# Patient Record
Sex: Male | Born: 1985 | Race: White | Hispanic: No | Smoking: Former smoker
Health system: Southern US, Community
[De-identification: ages and names within clinical notes are randomized; demographics above are authoritative.]

## PROBLEM LIST (undated history)

## (undated) DIAGNOSIS — E119 Type 2 diabetes mellitus without complications: Secondary | ICD-10-CM

## (undated) DIAGNOSIS — F411 Generalized anxiety disorder: Secondary | ICD-10-CM

## (undated) DIAGNOSIS — R7989 Other specified abnormal findings of blood chemistry: Secondary | ICD-10-CM

## (undated) DIAGNOSIS — S6290XA Unspecified fracture of unspecified wrist and hand, initial encounter for closed fracture: Secondary | ICD-10-CM

## (undated) HISTORY — DX: Other specified abnormal findings of blood chemistry: R79.89

## (undated) HISTORY — DX: Unspecified fracture of unspecified hand, initial encounter for closed fracture: S62.90XA

## (undated) HISTORY — DX: Type 2 diabetes mellitus without complications: E11.9

## (undated) HISTORY — DX: Generalized anxiety disorder: F41.1

---

## 2016-07-30 DIAGNOSIS — Z794 Long term (current) use of insulin: Secondary | ICD-10-CM | POA: Insufficient documentation

## 2016-07-30 DIAGNOSIS — G4733 Obstructive sleep apnea (adult) (pediatric): Secondary | ICD-10-CM | POA: Insufficient documentation

## 2016-07-30 DIAGNOSIS — E785 Hyperlipidemia, unspecified: Secondary | ICD-10-CM | POA: Insufficient documentation

## 2016-07-30 DIAGNOSIS — E119 Type 2 diabetes mellitus without complications: Secondary | ICD-10-CM | POA: Insufficient documentation

## 2017-04-17 ENCOUNTER — Emergency Department
Admission: EM | Admit: 2017-04-17 | Discharge: 2017-04-17 | Disposition: A | Discharge status: Other Acute Inpatient Hospital | Payer: Self-pay | Attending: Emergency Medicine | Admitting: Emergency Medicine

## 2017-04-17 ENCOUNTER — Emergency Department: Payer: Self-pay

## 2017-04-17 DIAGNOSIS — T5891XA Toxic effect of carbon monoxide from unspecified source, accidental (unintentional), initial encounter: Secondary | ICD-10-CM | POA: Insufficient documentation

## 2017-04-17 DIAGNOSIS — T5892XA Toxic effect of carbon monoxide from unspecified source, intentional self-harm, initial encounter: Secondary | ICD-10-CM

## 2017-04-17 DIAGNOSIS — Z5181 Encounter for therapeutic drug level monitoring: Secondary | ICD-10-CM | POA: Insufficient documentation

## 2017-04-17 LAB — CBC
HCT: 54.1 % — ABNORMAL HIGH (ref 40.0–52.0)
HEMOGLOBIN: 18.3 g/dL — AB (ref 13.0–18.0)
MCH: 29.9 pg (ref 26.0–34.0)
MCHC: 33.9 g/dL (ref 32.0–36.0)
MCV: 88.2 fL (ref 80.0–100.0)
PLATELETS: 234 10*3/uL (ref 150–440)
RBC: 6.13 MIL/uL — AB (ref 4.40–5.90)
RDW: 13.6 % (ref 11.5–14.5)
WBC: 27.1 10*3/uL — AB (ref 3.8–10.6)

## 2017-04-17 LAB — COMPREHENSIVE METABOLIC PANEL
ALBUMIN: 5 g/dL (ref 3.5–5.0)
ALK PHOS: 86 U/L (ref 38–126)
ALT: 48 U/L (ref 17–63)
ANION GAP: 16 — AB (ref 5–15)
AST: 105 U/L — AB (ref 15–41)
BILIRUBIN TOTAL: 1 mg/dL (ref 0.3–1.2)
BUN: 20 mg/dL (ref 6–20)
CO2: 20 mmol/L — ABNORMAL LOW (ref 22–32)
Calcium: 9.4 mg/dL (ref 8.9–10.3)
Chloride: 102 mmol/L (ref 101–111)
Creatinine, Ser: 2.02 mg/dL — ABNORMAL HIGH (ref 0.61–1.24)
GFR calc Af Amer: 48 mL/min — ABNORMAL LOW (ref >60)
GFR, EST NON AFRICAN AMERICAN: 42 mL/min — AB (ref >60)
GLUCOSE: 292 mg/dL — AB (ref 65–99)
Potassium: 5.2 mmol/L — ABNORMAL HIGH (ref 3.5–5.1)
Sodium: 138 mmol/L (ref 135–145)
TOTAL PROTEIN: 8.8 g/dL — AB (ref 6.5–8.1)

## 2017-04-17 LAB — URINE DRUG SCREEN, QUALITATIVE (ARMC ONLY)
Amphetamines, Ur Screen: NOT DETECTED
BARBITURATES, UR SCREEN: NOT DETECTED
BENZODIAZEPINE, UR SCRN: NOT DETECTED
COCAINE METABOLITE, UR ~~LOC~~: NOT DETECTED
Cannabinoid 50 Ng, Ur ~~LOC~~: NOT DETECTED
MDMA (Ecstasy)Ur Screen: NOT DETECTED
METHADONE SCREEN, URINE: NOT DETECTED
OPIATE, UR SCREEN: NOT DETECTED
PHENCYCLIDINE (PCP) UR S: NOT DETECTED
Tricyclic, Ur Screen: NOT DETECTED

## 2017-04-17 LAB — COOXEMETRY PANEL
O2 Saturation: 100 %
O2 Saturation: 96.8 %

## 2017-04-17 LAB — BLOOD GAS, ARTERIAL
ACID-BASE DEFICIT: 8.2 mmol/L — AB (ref 0.0–2.0)
Bicarbonate: 19.6 mmol/L — ABNORMAL LOW (ref 20.0–28.0)
FIO2: 1
O2 SAT: 96.4 %
PATIENT TEMPERATURE: 37
PCO2 ART: 48 mmHg (ref 32.0–48.0)
pH, Arterial: 7.22 — ABNORMAL LOW (ref 7.350–7.450)
pO2, Arterial: 101 mmHg (ref 83.0–108.0)

## 2017-04-17 LAB — SALICYLATE LEVEL: Salicylate Lvl: <7 mg/dL (ref 2.8–30.0)

## 2017-04-17 LAB — URINALYSIS, COMPLETE (UACMP) WITH MICROSCOPIC
BILIRUBIN URINE: NEGATIVE
Bacteria, UA: NONE SEEN
KETONES UR: 20 mg/dL — AB
LEUKOCYTES UA: NEGATIVE
NITRITE: NEGATIVE
PH: 5 (ref 5.0–8.0)
Protein, ur: 100 mg/dL — AB
SPECIFIC GRAVITY, URINE: 1.014 (ref 1.005–1.030)

## 2017-04-17 LAB — TROPONIN I: TROPONIN I: 0.37 ng/mL — AB (ref <0.03)

## 2017-04-17 LAB — ACETAMINOPHEN LEVEL

## 2017-04-17 LAB — ETHANOL: Alcohol, Ethyl (B): <5 mg/dL (ref <5)

## 2017-04-17 MED ORDER — SUCCINYLCHOLINE CHLORIDE 20 MG/ML IJ SOLN
200.0000 mg | Freq: Once | INTRAMUSCULAR | Status: AC
  Administered 2017-04-17: 200 mg via INTRAVENOUS

## 2017-04-17 MED ORDER — ETOMIDATE 2 MG/ML IV SOLN
20.0000 mg | Freq: Once | INTRAVENOUS | Status: AC
  Administered 2017-04-17: 20 mg via INTRAVENOUS

## 2017-04-17 MED ORDER — SODIUM CHLORIDE 0.9 % IV BOLUS (SEPSIS)
1000.0000 mL | Freq: Once | INTRAVENOUS | Status: AC
  Administered 2017-04-17: 1000 mL via INTRAVENOUS

## 2017-04-17 MED ORDER — PROPOFOL 1000 MG/100ML IV EMUL
INTRAVENOUS | Status: AC
  Administered 2017-04-17: 13.889 ug/kg/min via INTRAVENOUS
  Filled 2017-04-17: qty 100

## 2017-04-17 MED ORDER — ROCURONIUM BROMIDE 50 MG/5ML IV SOLN
10.0000 mg | Freq: Once | INTRAVENOUS | Status: AC
  Administered 2017-04-17: 10 mg via INTRAVENOUS

## 2017-04-17 MED ORDER — PROPOFOL 1000 MG/100ML IV EMUL
INTRAVENOUS | Status: AC
  Filled 2017-04-17: qty 100

## 2017-04-17 MED ORDER — PROPOFOL 1000 MG/100ML IV EMUL
5.0000 ug/kg/min | Freq: Once | INTRAVENOUS | Status: AC
  Administered 2017-04-17: 13.889 ug/kg/min via INTRAVENOUS

## 2017-04-17 NOTE — ED Notes (Signed)
Report given to [facility] ed charge nurse, Jeanice Lim

## 2017-04-17 NOTE — ED Notes (Signed)
Report given to [facility] 

## 2017-04-17 NOTE — ED Notes (Signed)
7.5 ETT inserted by Dr Manson Passey, taped 25 at the lip and secured; placement verified by CO2 detector and auscultation

## 2017-04-17 NOTE — ED Notes (Signed)
[facility] at bedside 

## 2017-04-17 NOTE — ED Notes (Signed)
16Fr temp foley placed without difficulty; draining tea colored urine

## 2017-04-17 NOTE — ED Notes (Signed)
PCXR completed.

## 2017-04-17 NOTE — ED Triage Notes (Addendum)
Pt arrives unresponsive, possible suicide attempt
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
## 2017-04-17 NOTE — ED Notes (Addendum)
Dr Manson Passey notified of pt's troponin; lung sounds clear; apical audible & regular; abd soft/nondist, +PP, -edema

## 2017-04-19 DIAGNOSIS — T5892XA Toxic effect of carbon monoxide from unspecified source, intentional self-harm, initial encounter: Secondary | ICD-10-CM | POA: Insufficient documentation

## 2017-04-24 DIAGNOSIS — M21372 Foot drop, left foot: Secondary | ICD-10-CM | POA: Insufficient documentation

## 2017-04-24 DIAGNOSIS — R29898 Other symptoms and signs involving the musculoskeletal system: Secondary | ICD-10-CM | POA: Insufficient documentation

## 2017-05-05 ENCOUNTER — Ambulatory Visit: Payer: Self-pay | Attending: Nephrology | Admitting: Physical Therapy

## 2017-06-15 DIAGNOSIS — F3341 Major depressive disorder, recurrent, in partial remission: Secondary | ICD-10-CM | POA: Insufficient documentation

## 2018-08-02 IMAGING — DX DG ABDOMEN 1V
1 series · 1 of 1 positions shown · non-contrast
Comparison: None.

CLINICAL DATA: OG tube placement.

EXAM:
ABDOMEN - 1 VIEW

[abdomen kub]
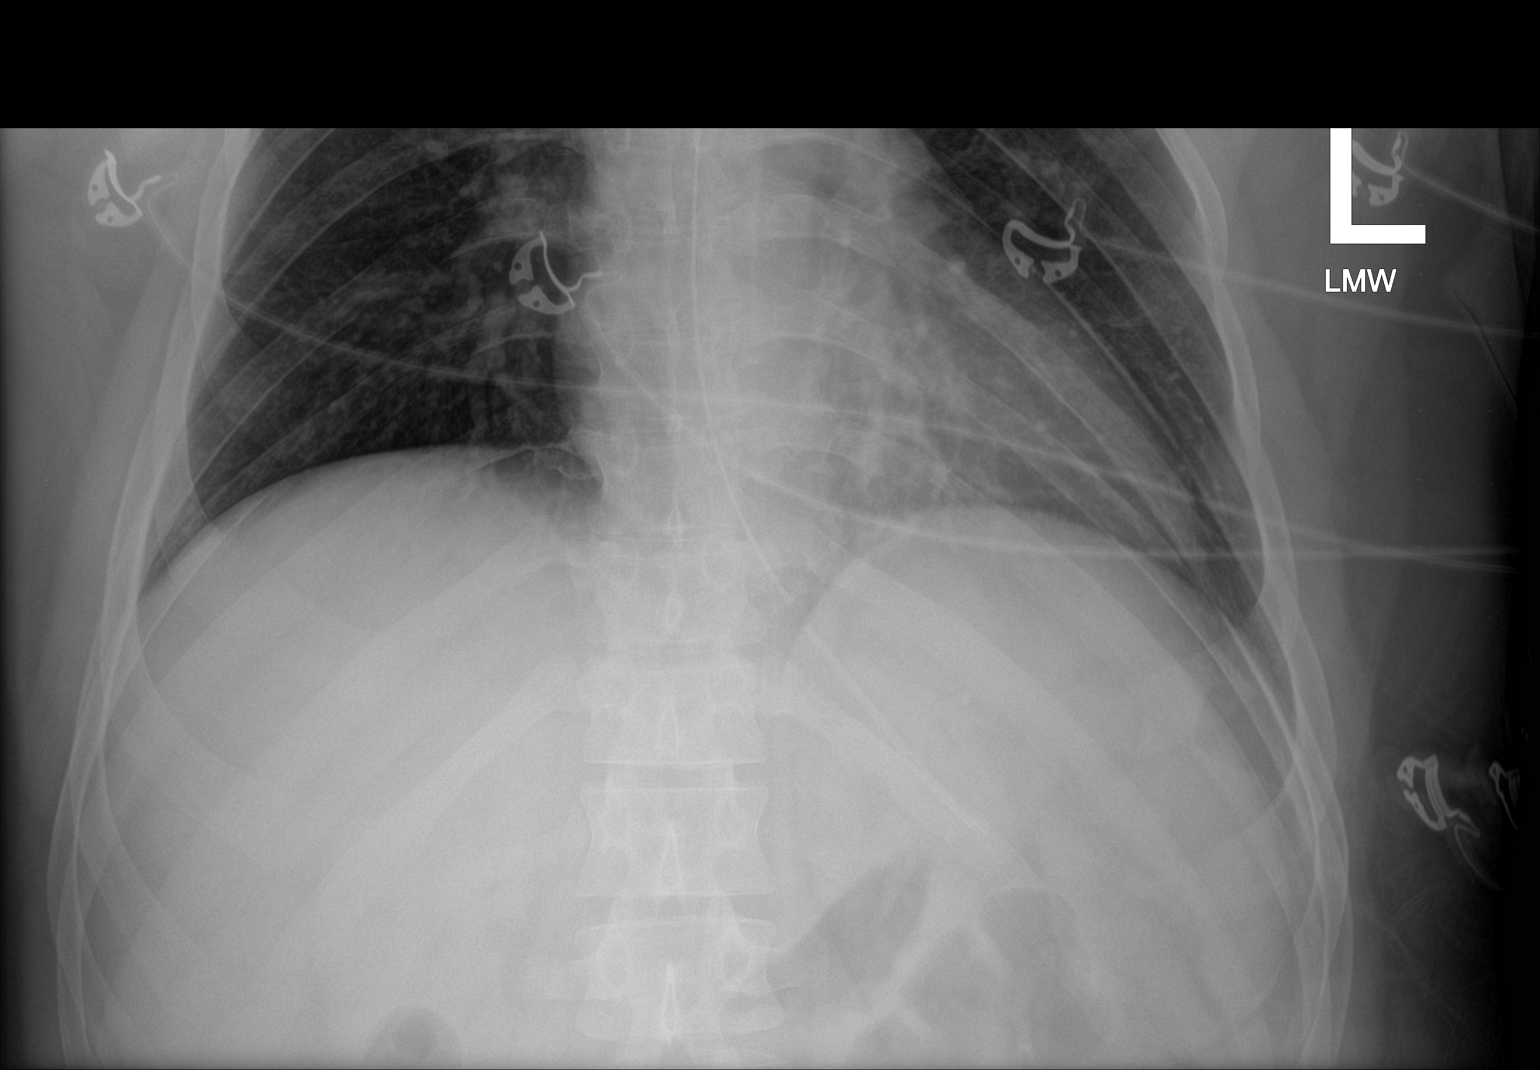

[1 of 1 positions shown; findings below may reference images not displayed]

FINDINGS: Tip of the enteric tube is below the diaphragm in the stomach,
side-port in the region of the gastroesophageal junction. Recommend
advancement of at least 4 cm for optimal placement. Upper abdominal
bowel gas pattern is normal. No evidence of free air.
IMPRESSION: Tip of the enteric tube below the diaphragm. The side port is in the
region of the gastroesophageal junction, advancement of at least 4
cm is recommended for optimal placement.

## 2019-12-12 DIAGNOSIS — K760 Fatty (change of) liver, not elsewhere classified: Secondary | ICD-10-CM | POA: Insufficient documentation

## 2020-04-03 ENCOUNTER — Encounter (HOSPITAL_COMMUNITY): Payer: Self-pay | Admitting: Licensed Clinical Social Worker

## 2020-04-03 ENCOUNTER — Ambulatory Visit (INDEPENDENT_AMBULATORY_CARE_PROVIDER_SITE_OTHER): Payer: 59 | Admitting: Licensed Clinical Social Worker

## 2020-04-03 ENCOUNTER — Other Ambulatory Visit: Payer: Self-pay

## 2020-04-03 DIAGNOSIS — F411 Generalized anxiety disorder: Secondary | ICD-10-CM | POA: Insufficient documentation

## 2020-04-03 DIAGNOSIS — F4310 Post-traumatic stress disorder, unspecified: Secondary | ICD-10-CM | POA: Insufficient documentation
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
## 2020-04-04 ENCOUNTER — Ambulatory Visit (INDEPENDENT_AMBULATORY_CARE_PROVIDER_SITE_OTHER): Payer: 59 | Admitting: Psychiatry

## 2020-04-04 ENCOUNTER — Other Ambulatory Visit: Payer: Self-pay

## 2020-04-04 ENCOUNTER — Encounter (HOSPITAL_COMMUNITY): Payer: Self-pay | Admitting: Psychiatry

## 2020-04-04 DIAGNOSIS — F411 Generalized anxiety disorder: Secondary | ICD-10-CM

## 2020-04-04 DIAGNOSIS — F410 Panic disorder [episodic paroxysmal anxiety] without agoraphobia: Secondary | ICD-10-CM | POA: Diagnosis not present

## 2020-04-04 DIAGNOSIS — F4310 Post-traumatic stress disorder, unspecified: Secondary | ICD-10-CM | POA: Diagnosis not present

## 2020-04-04 MED ORDER — MIRTAZAPINE 15 MG PO TABS: ORAL_TABLET | ORAL | 0 refills | Status: DC

## 2020-04-04 MED ORDER — PRAZOSIN HCL 2 MG PO CAPS: 2.0000 mg | ORAL_CAPSULE | Freq: Every day | ORAL | 2 refills | Status: DC

## 2020-04-04 MED ORDER — ZOLPIDEM TARTRATE 5 MG PO TABS
5.0000 mg | ORAL_TABLET | Freq: Every evening | ORAL | 1 refills | Status: DC | PRN
Start: 2020-04-04 — End: 2020-05-10

## 2020-04-04 MED ORDER — LORAZEPAM 1 MG PO TABS: 1.0000 mg | ORAL_TABLET | Freq: Every day | ORAL | 0 refills | Status: DC | PRN

## 2020-04-04 NOTE — Progress Notes (Signed)
Psychiatric Initial Adult Assessment   Patient Identification: Kyle Schmidt MRN:  [id number] Date of Evaluation:  04/04/2020 Referral Source: [facility] Chief Complaint:   Chief Complaint    Anxiety; Establish Care     Interview was conducted using WebEx teleconferencing application and I verified that I was speaking with the correct person using two identifiers. I discussed the limitations of evaluation and management by telemedicine and  the availability of in person appointments. Patient expressed understanding and agreed to proceed.  Visit Diagnosis:    ICD-10-CM   1. Panic disorder  F41.0   2. PTSD (post-traumatic stress disorder)  F43.10   3. GAD (generalized anxiety disorder)  F41.1     History of Present Illness:  Kyle Schmidt is a [name] referred to [place] by his PCP for treatment of PTSD/mixed anxiety disorder. He has ongoing symptoms of PTSD including nightmares, avoidance of crowds, irritability, panic anxiety, startle response. Repeated trauma related to combat experience in [place] is at a core of these symptoms. Interestingly enough he did not receive any treatment in the [employer]. He has a hx of depression with one suicide attempt by anoxia - was admitted to [facility] in 2018. At this time he denies having any depression, feelings of hopelessness, suicidal thoughts since 2019. He was previously on fluoxetine 40 mg which worked well but caused anorgasmia so he stopped taking it.  He was more recently prescribed bupropion XR by his PCP - he has been on it for over two months but has not noticed any improvement in anxiety. Prazosin was added for nightmares and it is "somewhat" helpful. Kyle Schmidt has no hx of mania, psychosis, alcohol or drug abuse. No family psychiatric history.  Medical history: DM type 2, dyslipidemia.   Associated Signs/Symptoms: Depression Symptoms:  panic attacks, disturbed sleep, (Hypo) Manic Symptoms:   Irritable Mood, Anxiety Symptoms:  Excessive Worry, Panic Symptoms, Psychotic Symptoms:  Denies PTSD Symptoms: Had a traumatic exposure:  combat trauma. Re-experiencing:  Nightmares Hypervigilance:  Yes Avoidance:  avoids crowds.  Past Psychiatric History: See above.  Previous Psychotropic Medications: Yes   Substance Abuse History in the last 12 months:  No.  Consequences of Substance Abuse: NA  Past Medical History:  Past Medical History:  Diagnosis Date  . Diabetes mellitus, type II (HCC)    History reviewed. No pertinent surgical history.  Family Psychiatric History: None known.  Family History: History reviewed. No pertinent family history.  Social History:   Social History   Socioeconomic History  . Marital status: Divorced    Spouse name: Not on file  . Number of children: 2  . Years of education: Not on file  . Highest education level: Not on file  Occupational History  . Not on file  Tobacco Use  . Smoking status: Former Smoker    Quit date: 01/03/2015    Years since quitting: 5.2  . Smokeless tobacco: Never Used  Substance and Sexual Activity  . Alcohol use: Never  . Drug use: Never  . Sexual activity: Yes  Other Topics Concern  . Not on file  Social History Narrative  . Not on file   Social Determinants of Health   Financial Resource Strain:   . Difficulty of Paying Living Expenses:   Food Insecurity:   . Worried About Programme researcher, broadcasting/film/video in the Last Year:   . Barista in the Last Year:   Transportation Needs:   . Freight forwarder (Medical):   Marland Kitchen  Lack of Transportation (Non-Medical):   Physical Activity:   . Days of Exercise per Week:   . Minutes of Exercise per Session:   Stress:   . Feeling of Stress :   Social Connections:   . Frequency of Communication with Friends and Family:   . Frequency of Social Gatherings with Friends and Family:   . Attends Religious Services:   . Active Member of Clubs or Organizations:   .  Attends Banker Meetings:   Marland Kitchen Marital Status:     Additional Social History: Divorced, shares custody of two children (son 66, daughter 98) with ex-wife. Lives with SO and works for [employer].  Allergies:  No Known Allergies  Metabolic Disorder Labs: No results found for: HGBA1C, MPG No results found for: PROLACTIN No results found for: CHOL, TRIG, HDL, CHOLHDL, VLDL, LDLCALC No results found for: TSH  Therapeutic Level Labs: No results found for: LITHIUM No results found for: CBMZ No results found for: VALPROATE  Current Medications: Current Outpatient Medications  Medication Sig Dispense Refill  . LORazepam (ATIVAN) 1 MG tablet Take 1 tablet (1 mg total) by mouth daily as needed for anxiety. 30 tablet 0  . mirtazapine (REMERON) 15 MG tablet Take 1 tablet (15 mg total) by mouth at bedtime for 10 days, THEN 2 tablets (30 mg total) at bedtime. 70 tablet 0  . prazosin (MINIPRESS) 2 MG capsule Take 1 capsule (2 mg total) by mouth at bedtime. 30 capsule 2  . zolpidem (AMBIEN) 5 MG tablet Take 1 tablet (5 mg total) by mouth at bedtime as needed for sleep. 30 tablet 1   No current facility-administered medications for this visit.    Psychiatric Specialty Exam: Review of Systems  Psychiatric/Behavioral: Positive for sleep disturbance. The patient is nervous/anxious.   All other systems reviewed and are negative.   There were no vitals taken for this visit.There is no height or weight on file to calculate BMI.  General Appearance: Casual and Fairly Groomed  Eye Contact:  Good  Speech:  Clear and Coherent and Normal Rate  Volume:  Normal  Mood:  Anxious  Affect:  Full Range  Thought Process:  Goal Directed and Linear  Orientation:  Full (Time, Place, and Person)  Thought Content:  Logical  Suicidal Thoughts:  No  Homicidal Thoughts:  No  Memory:  Immediate;   Good Recent;   Good Remote;   Good  Judgement:  Good  Insight:  Good  Psychomotor Activity:  Normal   Concentration:  Concentration: Fair  Recall:  Good  Fund of Knowledge:Good  Language: Good  Akathisia:  Negative  Handed:  Right  AIMS (if indicated):  not done  Assets:  Communication Skills Desire for Improvement Financial Resources/Insurance Housing Intimacy Social Support Talents/Skills  ADL's:  Intact  Cognition: WNL  Sleep:  Poor   Assessment and Plan: [name] referred to [place] by his PCP for treatment of PTSD/mixed anxiety disorder. He has ongoing symptoms of PTSD including nightmares, avoidance of crowds, irritability, panic anxiety, startle response. Repeated trauma related to combat experience in [place] is at a core of these symptoms. Interestingly enough he did not receive any treatment in the [employer]. He has a hx of depression with one suicide attempt by anoxia - was admitted to [facility] in 2018. At this time he denies having any depression, feelings of hopelessness, suicidal thoughts since 2019. He was previously on fluoxetine 40 mg which worked well but caused anorgasmia so he  stopped taking it.  He was more recently prescribed bupropion XR by his PCP - he has been on it for over two months but has not noticed any improvement in anxiety. Prazosin was added for nightmares and it is "somewhat" helpful. Kyle Schmidt has no hx of mania, psychosis, alcohol or drug abuse  Dx: PTSD; Panic disorder/GAD  Plan: We will dc bupropion as not helpful or indicated for anxiety disorders/PTSD while continuing prazosin 2 mg at HS. I will add mirtazapine 15 mg x 10 days then increase to 30 mg at HS for anxiety/insomnia/PTSD. I will add zolpidem 5 mg prn insomnia and lorazepam 1 mg daily prn panic attacks (he typically has 1-2 per week). He will continue counseling with Ignacia Bayley. Next appointment with me in 5 weeks. The plan was discussed with patient who had an opportunity to ask questions and these were all answered. I spend 40 minutes in videoconferencing with the patient  and devoted approximately 50% of this time to explanation of diagnosis, discussion of treatment options and med education.  Stephanie Acre, MD 4/7/20212:11 PM

## 2020-04-09 ENCOUNTER — Telehealth (HOSPITAL_COMMUNITY): Payer: Self-pay

## 2020-04-09 ENCOUNTER — Telehealth (HOSPITAL_COMMUNITY): Payer: Self-pay | Admitting: *Deleted

## 2020-04-09 ENCOUNTER — Other Ambulatory Visit (HOSPITAL_COMMUNITY): Payer: Self-pay | Admitting: Psychiatry

## 2020-04-09 MED ORDER — DIAZEPAM 5 MG PO TABS: 5.0000 mg | ORAL_TABLET | Freq: Two times a day (BID) | ORAL | 0 refills | Status: DC | PRN

## 2020-04-09 NOTE — Telephone Encounter (Signed)
Spoke with pharmacy regarding clarification that patient's lorazepam is discontinued and valium is new prescription. Pharmacist voiced concerns of pt having filled lorazepam on 4/8. She states pt will need to bring in remaining lorazepam prescription when filling the valium prescription.  Pt was called and provided this information. Pt verbalizes understanding, stating his girlfriend is going to be picking up the medication as he is out of town and she will be made aware that lorazepam must be turned into pharmacist.

## 2020-04-09 NOTE — Telephone Encounter (Signed)
I ordered diazepam (Valium) 5 mg instead of lorazepam prn anxiety - he definitely should not take more than 2 if one tablet is not effective!

## 2020-04-09 NOTE — Telephone Encounter (Signed)
Writer spoke with pt regarding recent addition of Ativan 1mg  to medication regime. Pt states that 1,2,3,and 4 mg "did nothing" for panic attacks. Pt took 5mg   The next time and basically blacked out and has since stopped taking them. Pt is asking if there is something else he can take? Pt next appointment 05/10/20. Please review.

## 2020-04-10 ENCOUNTER — Telehealth (HOSPITAL_COMMUNITY): Payer: Self-pay

## 2020-04-10 NOTE — Telephone Encounter (Signed)
Thank you :)

## 2020-04-10 NOTE — Telephone Encounter (Signed)
Pharmacist Jasmine December) from [place] [facility] called in regards to pt's valium prescription. Pharmacist states pt noncompliant with turning in lorazepam and became irate with pharmacy staff. Pharmacist asking for MD to advise.

## 2020-04-10 NOTE — Telephone Encounter (Signed)
I would give him/fill Rx for Valium - he reports lorazepam to be totally ineffective even when he takes triple dose - PTSD so his emotional control is not great. It is curious that he did not want to turn in lorazepam which he says does not work at all?

## 2020-04-10 NOTE — Telephone Encounter (Signed)
Pharmacy called and updated on MD advice.

## 2020-05-10 ENCOUNTER — Telehealth (INDEPENDENT_AMBULATORY_CARE_PROVIDER_SITE_OTHER): Payer: 59 | Admitting: Psychiatry

## 2020-05-10 ENCOUNTER — Other Ambulatory Visit: Payer: Self-pay

## 2020-05-10 DIAGNOSIS — F411 Generalized anxiety disorder: Secondary | ICD-10-CM | POA: Diagnosis not present

## 2020-05-10 DIAGNOSIS — F410 Panic disorder [episodic paroxysmal anxiety] without agoraphobia: Secondary | ICD-10-CM

## 2020-05-10 DIAGNOSIS — F4310 Post-traumatic stress disorder, unspecified: Secondary | ICD-10-CM

## 2020-05-10 MED ORDER — PRAZOSIN HCL 5 MG PO CAPS: 5.0000 mg | ORAL_CAPSULE | Freq: Every day | ORAL | 2 refills | Status: DC

## 2020-05-10 MED ORDER — MIRTAZAPINE 30 MG PO TABS: 30.0000 mg | ORAL_TABLET | Freq: Every day | ORAL | 0 refills | Status: DC

## 2020-05-10 MED ORDER — DIAZEPAM 5 MG PO TABS: 5.0000 mg | ORAL_TABLET | Freq: Every day | ORAL | 2 refills | Status: AC | PRN

## 2020-05-10 MED ORDER — ZOLPIDEM TARTRATE 5 MG PO TABS: 5.0000 mg | ORAL_TABLET | Freq: Every evening | ORAL | 1 refills | Status: DC | PRN

## 2020-05-10 NOTE — Progress Notes (Signed)
BH MD/PA/NP OP Progress Note  05/10/2020 10:40 AM Kyle Schmidt  MRN:  [id number] Interview was conducted using videoconferencing application and I verified that I was speaking with the correct person using two identifiers. I discussed the limitations of evaluation and management by telemedicine and  the availability of in person appointments. Patient expressed understanding and agreed to proceed.  Chief Complaint: "I feel better but nightmares still occasionally appear".  HPI: [name] referred to [place] by his PCP for treatment of PTSD/mixed anxiety disorder. He has ongoing symptoms of PTSD including nightmares, avoidance of crowds, irritability, panic anxiety, startle response. Repeated trauma related to combat experience in [place] is at a core of these symptoms. Interestingly enough he did not receive any treatment in the [employer]. He has a hx of depression with one suicide attempt by anoxia - was admitted to [facility] in 2018. At this time he denies having any depression, feelings of hopelessness, suicidal thoughts since 2019. He was previously on fluoxetine 40 mg which worked well but caused anorgasmia so he stopped taking it.  He was more recently prescribed bupropion XR by his PCP - he has been on it for over two months but has not noticed any improvement in anxiety. Prazosin was added for nightmares and it is "somewhat" helpful. We have added mirtazapine instead of bupropion and mood has improved. Ambien was added for insomnia and diazepam 5 mg for occasional panic attcks (lorazepam proved ineffective).   Visit Diagnosis:    ICD-10-CM   1. GAD (generalized anxiety disorder)  F41.1   2. PTSD (post-traumatic stress disorder)  F43.10   3. Panic disorder  F41.0     Past Psychiatric History: Please see intake H&P.  Past Medical History:  Past Medical History:  Diagnosis Date  . Diabetes mellitus, type II ([facility])    No past surgical history on file.  Family Psychiatric History:  None.  Family History: No family history on file.  Social History:  Social History   Socioeconomic History  . Marital status: Divorced    Spouse name: Not on file  . Number of children: 2  . Years of education: Not on file  . Highest education level: Not on file  Occupational History  . Not on file  Tobacco Use  . Smoking status: Former Smoker    Quit date: 01/03/2015    Years since quitting: 5.3  . Smokeless tobacco: Never Used  Substance and Sexual Activity  . Alcohol use: Never  . Drug use: Never  . Sexual activity: Yes  Other Topics Concern  . Not on file  Social History Narrative  . Not on file   Social Determinants of Health   Financial Resource Strain:   . Difficulty of Paying Living Expenses:   Food Insecurity:   . Worried About Charity fundraiser in the Last Year:   . Arboriculturist in the Last Year:   Transportation Needs:   . Film/video editor (Medical):   Marland Kitchen Lack of Transportation (Non-Medical):   Physical Activity:   . Days of Exercise per Week:   . Minutes of Exercise per Session:   Stress:   . Feeling of Stress :   Social Connections:   . Frequency of Communication with Friends and Family:   . Frequency of Social Gatherings with Friends and Family:   . Attends Religious Services:   . Active Member of Clubs or Organizations:   . Attends Archivist Meetings:   Marland Kitchen Marital  Status:     Allergies: No Known Allergies  Metabolic Disorder Labs: No results found for: HGBA1C, MPG No results found for: PROLACTIN No results found for: CHOL, TRIG, HDL, CHOLHDL, VLDL, LDLCALC No results found for: TSH  Therapeutic Level Labs: No results found for: LITHIUM No results found for: VALPROATE No components found for:  CBMZ  Current Medications: Current Outpatient Medications  Medication Sig Dispense Refill  . diazepam (VALIUM) 5 MG tablet Take 1 tablet (5 mg total) by mouth daily as needed for anxiety. 30 tablet 2  . mirtazapine (REMERON)  30 MG tablet Take 1 tablet (30 mg total) by mouth at bedtime. 90 tablet 0  . prazosin (MINIPRESS) 5 MG capsule Take 1 capsule (5 mg total) by mouth at bedtime. 30 capsule 2  . [START ON 06/04/2020] zolpidem (AMBIEN) 5 MG tablet Take 1 tablet (5 mg total) by mouth at bedtime as needed for sleep. 30 tablet 1   No current facility-administered medications for this visit.     Psychiatric Specialty Exam: Review of Systems  Psychiatric/Behavioral: Positive for sleep disturbance. The patient is nervous/anxious.   All other systems reviewed and are negative.   There were no vitals taken for this visit.There is no height or weight on file to calculate BMI.  General Appearance: Casual and Fairly Groomed  Eye Contact:  Good  Speech:  Clear and Coherent and Normal Rate  Volume:  Normal  Mood:  MIld anxiety.  Affect:  Full Range  Thought Process:  Goal Directed and Linear  Orientation:  Full (Time, Place, and Person)  Thought Content: Logical   Suicidal Thoughts:  No  Homicidal Thoughts:  No  Memory:  Immediate;   Good Recent;   Good Remote;   Good  Judgement:  Good  Insight:  Good  Psychomotor Activity:  Normal  Concentration:  Concentration: Good  Recall:  Good  Fund of Knowledge: Good  Language: Good  Akathisia:  Negative  Handed:  Right  AIMS (if indicated): not done  Assets:  Communication Skills Desire for Improvement Financial Resources/Insurance Housing Physical Health Talents/Skills  ADL's:  Intact  Cognition: WNL  Sleep:  Fair     Assessment and Plan: [name] referred to [place] by his PCP for treatment of PTSD/mixed anxiety disorder. He has ongoing symptoms of PTSD including nightmares, avoidance of crowds, irritability, panic anxiety, startle response. Repeated trauma related to combat experience in [place] is at a core of these symptoms. Interestingly enough he did not receive any treatment in the [employer]. He has a hx of depression with one suicide attempt by  anoxia - was admitted to [facility] in 2018. At this time he denies having any depression, feelings of hopelessness, suicidal thoughts since 2019. He was previously on fluoxetine 40 mg which worked well but caused anorgasmia so he stopped taking it.  He was more recently prescribed bupropion XR by his PCP - he has been on it for over two months but has not noticed any improvement in anxiety. Prazosin was added for nightmares and it is "somewhat" helpful. We have added mirtazapine instead of bupropion and mood has improved. Ambien was added for insomnia and diazepam 5 mg for occasional panic attcks (lorazepam proved ineffective).   Dx: PTSD; Panic disorder/GAD  Plan: We will continue mirtazapine 30 mg, zolpidem 5 mg prn insomnia, diazepam 5 mg prn anxiety attacks and increase prazosin to 5 mg at HS for nightmares. Next appointment with me in 3 months. The plan was discussed with  patient who had an opportunity to ask questions and these were all answered. I spend 20 minutes in videoconferencing with the patient.     Magdalene Patricia, MD 05/10/2020, 10:40 AM

## 2020-08-10 ENCOUNTER — Other Ambulatory Visit: Payer: Self-pay

## 2020-08-10 ENCOUNTER — Telehealth (INDEPENDENT_AMBULATORY_CARE_PROVIDER_SITE_OTHER): Payer: 59 | Admitting: Psychiatry

## 2020-08-10 DIAGNOSIS — F411 Generalized anxiety disorder: Secondary | ICD-10-CM

## 2020-08-10 DIAGNOSIS — F410 Panic disorder [episodic paroxysmal anxiety] without agoraphobia: Secondary | ICD-10-CM | POA: Diagnosis not present

## 2020-08-10 DIAGNOSIS — F4310 Post-traumatic stress disorder, unspecified: Secondary | ICD-10-CM | POA: Diagnosis not present

## 2020-08-10 MED ORDER — ZOLPIDEM TARTRATE 5 MG PO TABS: 5.0000 mg | ORAL_TABLET | Freq: Every evening | ORAL | 5 refills | Status: DC | PRN

## 2020-08-10 MED ORDER — PRAZOSIN HCL 5 MG PO CAPS: 5.0000 mg | ORAL_CAPSULE | Freq: Every day | ORAL | 1 refills | Status: DC

## 2020-08-10 MED ORDER — MIRTAZAPINE 30 MG PO TABS: 30.0000 mg | ORAL_TABLET | Freq: Every day | ORAL | 1 refills | Status: DC

## 2020-08-10 MED ORDER — DIAZEPAM 5 MG PO TABS: 5.0000 mg | ORAL_TABLET | Freq: Every day | ORAL | 5 refills | Status: DC | PRN

## 2020-08-10 NOTE — Progress Notes (Signed)
BH MD/PA/NP OP Progress Note  08/10/2020 10:12 AM Kyle Schmidt  MRN:  [id number] Interview was conducted using videoconferencing application and I verified that I was speaking with the correct person using two identifiers. I discussed the limitations of evaluation and management by telemedicine and  the availability of in person appointments. Patient expressed understanding and agreed to proceed. Patient location - home; physician - home office.  Chief Complaint: Occasional waking up, anxiety.  HPI: [name] with symptoms of PTSD including nightmares, avoidance of crowds, irritability, panic anxiety, startle response. Repeated trauma related to combat experience in [place] is at a core of these symptoms. Interestingly enough he did not receive any treatment in the [employer]. He has a hx of depression with one suicide attempt by anoxia - was admitted to [facility] in 2018. At this time he denies having any depression, feelings of hopelessness, suicidal thoughts since 2019. He was previously on fluoxetine 40 mg which worked well but caused anorgasmia so he stopped taking it. He was more recently prescribed bupropion XR by his PCP - he has been on it for over two months but has not noticed any improvement in anxiety. Prazosin was added for nightmares and it is helpful as he no longer recalls what he was dreaming about even if he wakes up in the middle of the night (happens once/twice a week). We have added mirtazapine instead of bupropion and mood has improved. Ambien was added for insomnia and diazepam 5 mg for occasional panic attcks (lorazepam proved ineffective).   Visit Diagnosis:    ICD-10-CM   1. Panic disorder  F41.0   2. GAD (generalized anxiety disorder)  F41.1   3. PTSD (post-traumatic stress disorder)  F43.10     Past Psychiatric History: Please see intake H&P.  Past Medical History:  Past Medical History:  Diagnosis Date  . Diabetes mellitus, type II (HCC)    No past  surgical history on file.  Family Psychiatric History: None.  Family History: No family history on file.  Social History:  Social History   Socioeconomic History  . Marital status: Divorced    Spouse name: Not on file  . Number of children: 2  . Years of education: Not on file  . Highest education level: Not on file  Occupational History  . Not on file  Tobacco Use  . Smoking status: Former Smoker    Quit date: 01/03/2015    Years since quitting: 5.6  . Smokeless tobacco: Never Used  Vaping Use  . Vaping Use: Never used  Substance and Sexual Activity  . Alcohol use: Never  . Drug use: Never  . Sexual activity: Yes  Other Topics Concern  . Not on file  Social History Narrative  . Not on file   Social Determinants of Health   Financial Resource Strain:   . Difficulty of Paying Living Expenses:   Food Insecurity:   . Worried About Programme researcher, broadcasting/film/video in the Last Year:   . Barista in the Last Year:   Transportation Needs:   . Freight forwarder (Medical):   Marland Kitchen Lack of Transportation (Non-Medical):   Physical Activity:   . Days of Exercise per Week:   . Minutes of Exercise per Session:   Stress:   . Feeling of Stress :   Social Connections:   . Frequency of Communication with Friends and Family:   . Frequency of Social Gatherings with Friends and Family:   . Attends Religious  Services:   . Active Member of Clubs or Organizations:   . Attends Banker Meetings:   Marland Kitchen Marital Status:     Allergies: No Known Allergies  Metabolic Disorder Labs: No results found for: HGBA1C, MPG No results found for: PROLACTIN No results found for: CHOL, TRIG, HDL, CHOLHDL, VLDL, LDLCALC No results found for: TSH  Therapeutic Level Labs: No results found for: LITHIUM No results found for: VALPROATE No components found for:  CBMZ  Current Medications: Current Outpatient Medications  Medication Sig Dispense Refill  . diazepam (VALIUM) 5 MG tablet Take 1  tablet (5 mg total) by mouth daily as needed for anxiety. 30 tablet 5  . mirtazapine (REMERON) 30 MG tablet Take 1 tablet (30 mg total) by mouth at bedtime. 90 tablet 1  . prazosin (MINIPRESS) 5 MG capsule Take 1 capsule (5 mg total) by mouth at bedtime. 90 capsule 1  . zolpidem (AMBIEN) 5 MG tablet Take 1 tablet (5 mg total) by mouth at bedtime as needed for sleep. 30 tablet 5   No current facility-administered medications for this visit.      Psychiatric Specialty Exam: Review of Systems  All other systems reviewed and are negative.   There were no vitals taken for this visit.There is no height or weight on file to calculate BMI.  General Appearance: Casual and Well Groomed  Eye Contact:  Good  Speech:  Clear and Coherent and Normal Rate  Volume:  Normal  Mood:  Euthymic  Affect:  Full Range  Thought Process:  Goal Directed  Orientation:  Full (Time, Place, and Person)  Thought Content: Logical   Suicidal Thoughts:  No  Homicidal Thoughts:  No  Memory:  Immediate;   Good Recent;   Good Remote;   Good  Judgement:  Good  Insight:  Good  Psychomotor Activity:  Normal  Concentration:  Concentration: Good  Recall:  Good  Fund of Knowledge: Good  Language: Good  Akathisia:  Negative  Handed:  Right  AIMS (if indicated): not done  Assets:  Communication Skills Desire for Improvement Financial Resources/Insurance Housing Resilience Talents/Skills  ADL's:  Intact  Cognition: WNL  Sleep:  Good    Assessment and Plan: [name] with symptoms of PTSD including nightmares, avoidance of crowds, irritability, panic anxiety, startle response. Repeated trauma related to combat experience in [place] is at a core of these symptoms. Interestingly enough he did not receive any treatment in the [employer]. He has a hx of depression with one suicide attempt by anoxia - was admitted to [facility] in 2018. At this time he denies having any depression, feelings of hopelessness,  suicidal thoughts since 2019. He was previously on fluoxetine 40 mg which worked well but caused anorgasmia so he stopped taking it. He was more recently prescribed bupropion XR by his PCP - he has been on it for over two months but has not noticed any improvement in anxiety. Prazosin was added for nightmares and it is helpful as he no longer recalls what he was dreaming about even if he wakes up in the middle of the night (happens once/twice a week). We have added mirtazapine instead of bupropion and mood has improved. Ambien was added for insomnia and diazepam 5 mg for occasional panic attcks (lorazepam proved ineffective).   Dx: PTSD; Panic disorder/GAD  Plan: We will continue mirtazapine 30 mg, zolpidem 5 mg prn insomnia, diazepam 5 mg prn anxiety attacks and prazosin 5 mg at HS for nightmares. Next  appointment with me in 3 months.The plan was discussed with patient who had an opportunity to ask questions and these were all answered. I spend in videoconferencingwith the patient.     Magdalene Patricia, MD 08/10/2020, 10:12 AM

## 2020-11-16 ENCOUNTER — Telehealth (INDEPENDENT_AMBULATORY_CARE_PROVIDER_SITE_OTHER): Payer: 59 | Admitting: Psychiatry

## 2020-11-16 ENCOUNTER — Other Ambulatory Visit: Payer: Self-pay

## 2020-11-16 DIAGNOSIS — F4310 Post-traumatic stress disorder, unspecified: Secondary | ICD-10-CM | POA: Diagnosis not present

## 2020-11-16 DIAGNOSIS — F410 Panic disorder [episodic paroxysmal anxiety] without agoraphobia: Secondary | ICD-10-CM | POA: Diagnosis not present

## 2020-11-16 DIAGNOSIS — F411 Generalized anxiety disorder: Secondary | ICD-10-CM

## 2020-11-16 MED ORDER — DIAZEPAM 5 MG PO TABS: 5.0000 mg | ORAL_TABLET | Freq: Every day | ORAL | 1 refills | Status: AC | PRN

## 2020-11-16 MED ORDER — PRAZOSIN HCL 5 MG PO CAPS: 5.0000 mg | ORAL_CAPSULE | Freq: Every day | ORAL | 1 refills | Status: DC

## 2020-11-16 MED ORDER — ZOLPIDEM TARTRATE 5 MG PO TABS: 5.0000 mg | ORAL_TABLET | Freq: Every evening | ORAL | 1 refills | Status: DC | PRN

## 2020-11-16 MED ORDER — MIRTAZAPINE 30 MG PO TABS: 30.0000 mg | ORAL_TABLET | Freq: Every day | ORAL | 1 refills | Status: DC
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
## 2021-02-15 ENCOUNTER — Other Ambulatory Visit: Payer: Self-pay

## 2021-02-15 ENCOUNTER — Telehealth (HOSPITAL_COMMUNITY): Payer: Self-pay | Admitting: Psychiatry

## 2023-07-15 ENCOUNTER — Ambulatory Visit (INDEPENDENT_AMBULATORY_CARE_PROVIDER_SITE_OTHER): Payer: BLUE CROSS/BLUE SHIELD

## 2023-07-15 ENCOUNTER — Ambulatory Visit
Admission: EM | Admit: 2023-07-15 | Discharge: 2023-07-15 | Disposition: A | Discharge status: Home / Self Care | Payer: BLUE CROSS/BLUE SHIELD | Attending: Family Medicine | Admitting: Family Medicine

## 2023-07-15 ENCOUNTER — Encounter: Payer: Self-pay | Admitting: Emergency Medicine

## 2023-07-15 DIAGNOSIS — M79641 Pain in right hand: Secondary | ICD-10-CM

## 2023-07-15 DIAGNOSIS — S62306B Unspecified fracture of fifth metacarpal bone, right hand, initial encounter for open fracture: Secondary | ICD-10-CM | POA: Diagnosis not present

## 2023-07-15 DIAGNOSIS — S6991XA Unspecified injury of right wrist, hand and finger(s), initial encounter: Secondary | ICD-10-CM | POA: Diagnosis not present

## 2023-07-15 MED ORDER — CELECOXIB 200 MG PO CAPS: 200.0000 mg | ORAL_CAPSULE | Freq: Every day | ORAL | 0 refills | Status: AC

## 2023-07-15 MED ORDER — HYDROCODONE-ACETAMINOPHEN 5-325 MG PO TABS: 1.0000 | ORAL_TABLET | Freq: Three times a day (TID) | ORAL | 0 refills | Status: AC | PRN

## 2023-07-15 NOTE — Discharge Instructions (Addendum)
Advised patient to take medication as directed with food to completion.  Advised may take Norco for breakthrough right hand pain every 8 hours, as needed.  Advised patient may RICE affected area of right hand for 30 minutes 3 times daily.  Advised immediate orthopedic follow-up for fracture management.  Contact information provided with this AVS today.
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
## 2023-07-22 ENCOUNTER — Ambulatory Visit (INDEPENDENT_AMBULATORY_CARE_PROVIDER_SITE_OTHER): Payer: BLUE CROSS/BLUE SHIELD | Admitting: Family Medicine

## 2023-07-22 ENCOUNTER — Encounter: Payer: Self-pay | Admitting: Family Medicine

## 2023-07-22 VITALS — BP 120/78 | HR 85 | Temp 98.9°F | Resp 18 | Ht 74.5 in | Wt 243.9 lb

## 2023-07-22 DIAGNOSIS — E119 Type 2 diabetes mellitus without complications: Secondary | ICD-10-CM | POA: Diagnosis not present

## 2023-07-22 DIAGNOSIS — N529 Male erectile dysfunction, unspecified: Secondary | ICD-10-CM | POA: Diagnosis not present

## 2023-07-22 DIAGNOSIS — Z7689 Persons encountering health services in other specified circumstances: Secondary | ICD-10-CM

## 2023-07-22 DIAGNOSIS — Z1159 Encounter for screening for other viral diseases: Secondary | ICD-10-CM

## 2023-07-22 DIAGNOSIS — R5382 Chronic fatigue, unspecified: Secondary | ICD-10-CM

## 2023-07-22 DIAGNOSIS — E785 Hyperlipidemia, unspecified: Secondary | ICD-10-CM | POA: Diagnosis not present

## 2023-07-22 NOTE — Progress Notes (Signed)
New Patient Office Visit  Subjective    Patient ID: Kyle Schmidt, male    DOB: 04/15/1986  Age: 37 y.o. MRN: [id number]  CC:  Chief Complaint  Patient presents with   Establish Care    HPI Byrant Schmidt presents to establish care with this practice. He is new to me. He  has not been on medications for his chronic conditions for a while.   Diabetes: has not been on medication for a while. Last A1C 11.7 in 10/2019. Was on insulin at one time, not currently taking anything.   Right hand fracture: given hydrocodone and Celebrex. Not wrapped today. Diagnosed with fracture. Recommend ortho urgent care for hand brace.   HLD: not currently on medication.  ED: on Viagra at one time.      Outpatient Encounter Medications as of 07/22/2023  Medication Sig   celecoxib (CELEBREX) 200 MG capsule Take 1 capsule (200 mg total) by mouth daily for 15 days.   diazepam (VALIUM) 5 MG tablet Take 5 mg by mouth every 6 (six) hours as needed for anxiety.   prazosin (MINIPRESS) 2 MG capsule Take 2 mg by mouth at bedtime.   zolpidem (AMBIEN) 5 MG tablet Take 5 mg by mouth at bedtime as needed for sleep.   atorvastatin (LIPITOR) 40 MG tablet Take 40 mg by mouth daily. (Patient not taking: Reported on 07/22/2023)   glipiZIDE (GLUCOTROL) 5 MG tablet Take 5 mg by mouth daily before breakfast. (Patient not taking: Reported on 07/22/2023)   HYDROcodone-acetaminophen (NORCO/VICODIN) 5-325 MG tablet Take 1 tablet by mouth every 8 (eight) hours as needed for up to 8 days. (Patient not taking: Reported on 07/22/2023)   insulin degludec (TRESIBA FLEXTOUCH) 100 UNIT/ML FlexTouch Pen Inject 10 Units into the skin daily. (Patient not taking: Reported on 07/22/2023)   lisinopril (ZESTRIL) 20 MG tablet Take 1 tablet by mouth daily. (Patient not taking: Reported on 07/22/2023)   metFORMIN (GLUCOPHAGE-XR) 500 MG 24 hr tablet Take 1,500 mg by mouth daily with breakfast. (Patient not taking: Reported on 07/22/2023)    sildenafil (VIAGRA) 100 MG tablet Take 100 mg by mouth daily as needed. (Patient not taking: Reported on 07/22/2023)   [DISCONTINUED] empagliflozin (JARDIANCE) 10 MG TABS tablet Take 10 mg by mouth daily. (Patient not taking: Reported on 07/22/2023)   No facility-administered encounter medications on file as of 07/22/2023.    Past Medical History:  Diagnosis Date   Diabetes mellitus, type II (HCC)    Elevated LFTs    GAD (generalized anxiety disorder)    Hand fracture     History reviewed. No pertinent surgical history.  Family History  Problem Relation Age of Onset   Healthy Mother    Parkinson's disease Father     Social History   Socioeconomic History   Marital status: Divorced    Spouse name: Not on file   Number of children: 2   Years of education: Not on file   Highest education level: Not on file  Occupational History   Not on file  Tobacco Use   Smoking status: Former    Current packs/day: 0.00    Types: Cigarettes    Quit date: 01/03/2015    Years since quitting: 8.5    Passive exposure: Past   Smokeless tobacco: Never  Vaping Use   Vaping status: Never Used  Substance and Sexual Activity   Alcohol use: Yes    Comment: rarely   Drug use: Never   Sexual activity: Yes  Other  Topics Concern   Not on file  Social History Narrative   Not on file   Social Determinants of Health   Financial Resource Strain: Not on file  Food Insecurity: Not on file  Transportation Needs: Not on file  Physical Activity: Not on file  Stress: Not on file  Social Connections: Unknown (05/12/2022)   Received from [facility]   Social Network    Social Network: Not on file  Intimate Partner Violence: Unknown (03/31/2022)   Received from [facility]   HITS    Physically Hurt: Not on file    Insult or Talk Down To: Not on file    Threaten Physical Harm: Not on file    Scream or Curse: Not on file    Review of Systems  Constitutional:  Positive for malaise/fatigue.  Negative for weight loss.  Eyes:  Negative for blurred vision and double vision.  Respiratory:  Negative for shortness of breath.   Cardiovascular:  Negative for chest pain.  Gastrointestinal:  Negative for nausea and vomiting.  Musculoskeletal:  Negative for back pain and neck pain.  Neurological:  Negative for dizziness and headaches.  Endo/Heme/Allergies:  Negative for polydipsia.  Psychiatric/Behavioral:  Negative for depression and suicidal ideas.         Objective    BP 120/78   Pulse 85   Temp 98.9 F (37.2 C) (Oral)   Resp 18   Ht 6' 2.5" (1.892 m)   Wt 243 lb 14.4 oz (110.6 kg)   SpO2 98%   BMI 30.90 kg/m   Physical Exam Vitals and nursing note reviewed.  Constitutional:      General: He is not in acute distress.    Appearance: Normal appearance.  Cardiovascular:     Rate and Rhythm: Normal rate and regular rhythm.     Heart sounds: Normal heart sounds.  Pulmonary:     Effort: Pulmonary effort is normal.     Breath sounds: Normal breath sounds.  Skin:    General: Skin is warm and dry.     Capillary Refill: Capillary refill takes less than 2 seconds.  Neurological:     General: No focal deficit present.     Mental Status: He is alert. Mental status is at baseline.  Psychiatric:        Mood and Affect: Mood normal.        Behavior: Behavior normal.        Thought Content: Thought content normal.        Judgment: Judgment normal.        Flowsheet Row Office Visit from 07/22/2023 in [facility]  PHQ-9 Total Score 5         07/22/2023   11:11 AM  GAD 7 : Generalized Anxiety Score  Nervous, Anxious, on Edge 1  Control/stop worrying 0  Worry too much - different things 0  Trouble relaxing 1  Restless 0  Easily annoyed or irritable 0  Afraid - awful might happen 0  Total GAD 7 Score 2  Anxiety Difficulty Not difficult at all      Assessment & Plan:   Establishing care with new doctor, encounter for  Type 2  diabetes mellitus without complication, without long-term current use of insulin (HCC) -     CBC with Differential/Platelet -     Comprehensive metabolic panel -     Hemoglobin A1c -     Microalbumin / creatinine urine ratio  Hyperlipidemia, unspecified hyperlipidemia  type -     Lipid panel  Erectile dysfunction, unspecified erectile dysfunction type  Need for hepatitis C screening test -     Hepatitis C antibody  Screening for viral disease -     HIV Antibody (routine testing w rflx)  Chronic fatigue -     Hemoglobin A1c -     TSH   Untreated diabetes, HLD, and ED. Will get labs today to see where things stand. It is likely that treatment will be started as soon as results are available. He does not endorse symptoms of chest pain, shortness of breath, lower extremity edema, headaches, or excessive thirst or urination. Endorses fatigue likely from untreated diabetes. Vital signs are stable and blood pressure is well-controlled today.  Seen at urgent care for right hand fracture, not wearing brace today, recommend follow-up with ortho urgent care for further evaluation.  Agrees with plan of care discussed.  Questions answered.   Return in about 1 week (around 07/29/2023) for to discuss labs and treatment.   Novella Olive, FNP

## 2023-07-23 ENCOUNTER — Ambulatory Visit (INDEPENDENT_AMBULATORY_CARE_PROVIDER_SITE_OTHER): Payer: BLUE CROSS/BLUE SHIELD | Admitting: Family Medicine

## 2023-07-23 ENCOUNTER — Telehealth: Payer: Self-pay

## 2023-07-23 ENCOUNTER — Encounter: Payer: Self-pay | Admitting: Family Medicine

## 2023-07-23 VITALS — BP 127/87 | HR 74 | Temp 98.1°F | Ht 74.0 in | Wt 241.0 lb

## 2023-07-23 DIAGNOSIS — N529 Male erectile dysfunction, unspecified: Secondary | ICD-10-CM

## 2023-07-23 DIAGNOSIS — E039 Hypothyroidism, unspecified: Secondary | ICD-10-CM | POA: Diagnosis not present

## 2023-07-23 DIAGNOSIS — E785 Hyperlipidemia, unspecified: Secondary | ICD-10-CM

## 2023-07-23 DIAGNOSIS — Z794 Long term (current) use of insulin: Secondary | ICD-10-CM

## 2023-07-23 DIAGNOSIS — E119 Type 2 diabetes mellitus without complications: Secondary | ICD-10-CM | POA: Diagnosis not present

## 2023-07-23 LAB — COMPREHENSIVE METABOLIC PANEL
ALT: 32 IU/L (ref 0–44)
AST: 21 IU/L (ref 0–40)
Albumin: 4.6 g/dL (ref 4.1–5.1)
Alkaline Phosphatase: 141 IU/L — ABNORMAL HIGH (ref 44–121)
BUN/Creatinine Ratio: 15 (ref 9–20)
BUN: 14 mg/dL (ref 6–20)
CO2: 21 mmol/L (ref 20–29)
Calcium: 9.5 mg/dL (ref 8.7–10.2)
Chloride: 98 mmol/L (ref 96–106)
Creatinine, Ser: 0.92 mg/dL (ref 0.76–1.27)
Globulin, Total: 2.5 g/dL (ref 1.5–4.5)
Glucose: 334 mg/dL — ABNORMAL HIGH (ref 70–99)
Potassium: 4.5 mmol/L (ref 3.5–5.2)
Sodium: 136 mmol/L (ref 134–144)
Total Protein: 7.1 g/dL (ref 6.0–8.5)

## 2023-07-23 LAB — TSH: TSH: 6.98 u[IU]/mL — ABNORMAL HIGH (ref 0.450–4.500)

## 2023-07-23 LAB — HEMOGLOBIN A1C
Est. average glucose Bld gHb Est-mCnc: 258 mg/dL
Hgb A1c MFr Bld: 10.6 % — ABNORMAL HIGH (ref 4.8–5.6)

## 2023-07-23 LAB — CBC WITH DIFFERENTIAL/PLATELET
Basophils Absolute: 0.1 10*3/uL (ref 0.0–0.2)
Basos: 1 %
EOS (ABSOLUTE): 0.3 10*3/uL (ref 0.0–0.4)
Eos: 4 %
Immature Grans (Abs): 0 10*3/uL (ref 0.0–0.1)
Immature Granulocytes: 1 %
Lymphocytes Absolute: 2.4 10*3/uL (ref 0.7–3.1)
Lymphs: 32 %
MCHC: 33.5 g/dL (ref 31.5–35.7)
MCV: 89 fL (ref 79–97)
Monocytes Absolute: 0.6 10*3/uL (ref 0.1–0.9)
Monocytes: 7 %
Neutrophils Absolute: 4.1 10*3/uL (ref 1.4–7.0)
Neutrophils: 55 %
Platelets: 187 10*3/uL (ref 150–450)
RBC: 5.73 x10E6/uL (ref 4.14–5.80)
RDW: 12.9 % (ref 11.6–15.4)
WBC: 7.4 10*3/uL (ref 3.4–10.8)

## 2023-07-23 LAB — MICROALBUMIN / CREATININE URINE RATIO
Creatinine, Urine: 84.2 mg/dL
Microalb/Creat Ratio: 10 mg/g creat (ref 0–29)
Microalbumin, Urine: 8.1 ug/mL

## 2023-07-23 LAB — LIPID PANEL
Chol/HDL Ratio: 5.3 ratio — ABNORMAL HIGH (ref 0.0–5.0)
Cholesterol, Total: 211 mg/dL — ABNORMAL HIGH (ref 100–199)
HDL: 40 mg/dL (ref >39)
LDL Chol Calc (NIH): 124 mg/dL — ABNORMAL HIGH (ref 0–99)
Triglycerides: 265 mg/dL — ABNORMAL HIGH (ref 0–149)

## 2023-07-23 LAB — HIV ANTIBODY (ROUTINE TESTING W REFLEX): HIV Screen 4th Generation wRfx: NONREACTIVE

## 2023-07-23 LAB — HEPATITIS C ANTIBODY: Hep C Virus Ab: NONREACTIVE

## 2023-07-23 MED ORDER — ATORVASTATIN CALCIUM 40 MG PO TABS
40.0000 mg | ORAL_TABLET | Freq: Every day | ORAL | 0 refills | Status: AC
Start: 2023-07-23 — End: ?

## 2023-07-23 MED ORDER — LEVOTHYROXINE SODIUM 125 MCG PO TABS
125.0000 ug | ORAL_TABLET | Freq: Every day | ORAL | 0 refills | Status: AC
Start: 2023-07-23 — End: ?

## 2023-07-23 MED ORDER — SILDENAFIL CITRATE 100 MG PO TABS
100.0000 mg | ORAL_TABLET | Freq: Every day | ORAL | 0 refills | Status: AC | PRN
Start: 2023-07-23 — End: ?

## 2023-07-23 MED ORDER — FREESTYLE LIBRE 3 SENSOR MISC: 1.0000 | Freq: Two times a day (BID) | 2 refills | Status: DC

## 2023-07-23 MED ORDER — LANTUS SOLOSTAR 100 UNIT/ML ~~LOC~~ SOPN: 10.0000 [IU] | PEN_INJECTOR | Freq: Every day | SUBCUTANEOUS | 1 refills | Status: DC

## 2023-07-23 MED ORDER — GLIPIZIDE 5 MG PO TABS
5.0000 mg | ORAL_TABLET | Freq: Every day | ORAL | 0 refills | Status: AC
Start: 2023-07-23 — End: ?

## 2023-07-23 NOTE — Telephone Encounter (Signed)
-----   Message from Novella Olive sent at 07/23/2023  7:56 AM EDT ----- Please contact Cristal Deer and see if he can get on my schedule this afternoon. We have several issues to discuss concerning treatment. I recommend this happen asap. Thanks, Clydie Braun

## 2023-07-23 NOTE — Progress Notes (Signed)
Established Patient Office Visit  Subjective   Patient ID: Kyle Schmidt, male    DOB: February 19, 1986  Age: 37 y.o. MRN: [id number]  Chief Complaint  Patient presents with   Follow-up    Tbd lab results     HPI Presents to discuss lab results and to start treatment.  TSH: 6.980- discussed hypothyroidism, denies symptoms of sluggishness, cold intolerance, dry skin, and weight gain.  Will start  levothyroxine 125 mcg daily before breakfast. Follow-up in 6 weeks for repeat TSH to evaluate medication dose.  Lipid panel: Has been diagnosed with HLD in past.  Restart atorvastin 40 mg daily. Reports tolerating medication well in the past.  Diabetes (untreated): has been diagnosed with type 2 diabetes with worse A1C in the past. Current A1C is 10.6 will start Lantus 10  units SQ daily. Freestyle Libre sample given and prescription sent for continuous glucose monitoring.  Does not tolerate metformin due to GI upset, prefers glipizide.  Glipizide 5 mg daily  Erectile dysfunction: discussed importance of getting blood sugar under control for over all health and blood flow. Will send Viagra refill to pharmacy.       Review of Systems  Endo/Heme/Allergies:        Denies cold intolerance. Denies excessive thirst or urination.  Denies feeling sluggish. Denies weight gain. Denies dry skin.      Objective:     BP 127/87   Pulse 74   Temp 98.1 F (36.7 C) (Oral)   Ht 6\' 2"  (1.88 m)   Wt 241 lb (109.3 kg)   SpO2 99%   BMI 30.94 kg/m  BP Readings from Last 3 Encounters:  07/23/23 127/87  07/22/23 120/78  07/15/23 (!) 149/93      Physical Exam Vitals and nursing note reviewed.  Constitutional:      General: He is not in acute distress.    Appearance: Normal appearance.  Pulmonary:     Effort: Pulmonary effort is normal.  Skin:    General: Skin is warm and dry.  Neurological:     General: No focal deficit present.     Mental Status: He is alert. Mental status is at  baseline.  Psychiatric:        Mood and Affect: Mood normal.        Behavior: Behavior normal.        Thought Content: Thought content normal.        Judgment: Judgment normal.    No results found for any visits on 07/23/23.     The ASCVD Risk score (Arnett DK, et al., 2019) failed to calculate for the following reasons:   The 2019 ASCVD risk score is only valid for ages 67 to 62    Assessment & Plan:   Type 2 diabetes mellitus without complication, with long-term current use of insulin (HCC) -     Lantus SoloStar; Inject 10 Units into the skin daily.  Dispense: 15 mL; Refill: 1 -     FreeStyle Libre 3 Sensor; 1 each by Does not apply route 2 (two) times daily. Place 1 sensor on the skin every 14 days.  Dispense: 2 each; Refill: 2 -     glipiZIDE; Take 1 tablet (5 mg total) by mouth daily before breakfast.  Dispense: 90 tablet; Refill: 0  Hyperlipidemia, unspecified hyperlipidemia type -     Atorvastatin Calcium; Take 1 tablet (40 mg total) by mouth daily.  Dispense: 90 tablet; Refill: 0  Hypothyroidism, unspecified type -  Levothyroxine Sodium; Take 1 tablet (125 mcg total) by mouth daily.  Dispense: 60 tablet; Refill: 0 -     TSH  Erectile dysfunction, unspecified erectile dysfunction type -     Sildenafil Citrate; Take 1 tablet (100 mg total) by mouth daily as needed.  Dispense: 10 tablet; Refill: 0   Return in 4 weeks for follow-up for diabetes. He will have data from continuous glucose monitoring. Target blood sugars below.  Repeat TSH in 6 weeks. Adjust levothyroxine if needed.   Target blood sugar goals:  Fasting glucose 80-130 mg/dl Postprandial glucose (16-109 minutes after the meal) less than 180 mg/dl  Agrees with plan of care discussed.  Questions answered.   Return in about 4 weeks (around 08/20/2023) for diabetes.    Novella Olive, FNP

## 2023-07-23 NOTE — Telephone Encounter (Signed)
Patient was contacted by provide

## 2023-07-24 ENCOUNTER — Telehealth: Payer: Self-pay

## 2023-07-24 NOTE — Telephone Encounter (Signed)
.  Initiated Prior authorization for:Insulin Glargine Solostar 100UNIT/ML pen-injectors Via: Covermymeds Case/Key:[id number] Status: approved as of 07/24/23 Reason:Coverage End Date:07/23/2025; Notified Pt via: Mychart    following preferred options: Lantus (insulin glargine), Toujeo (insulin glargine), Levemir (insulin detemir), and Tresiba (insulin degludec)

## 2023-07-27 ENCOUNTER — Telehealth: Payer: Self-pay

## 2023-07-27 NOTE — Telephone Encounter (Signed)
Patient called stating that he needed a PA for Insulin 100 units.   [facility] [address]

## 2023-07-28 ENCOUNTER — Telehealth: Payer: Self-pay | Admitting: Family Medicine

## 2023-07-28 NOTE — Telephone Encounter (Signed)
Patient calling to state that the [facility] pharmacy is still refusing to fill prescription, stating there is no PA for it. Patient requesting it sent to a differing pharmacy in [place] if possible. Patient states will call back with preferred pharmacy information momentarily. Katha Hamming

## 2023-07-29 ENCOUNTER — Encounter: Payer: Self-pay | Admitting: Family Medicine

## 2023-07-30 ENCOUNTER — Telehealth: Payer: Self-pay

## 2023-07-30 ENCOUNTER — Other Ambulatory Visit: Payer: Self-pay | Admitting: Family Medicine

## 2023-07-30 DIAGNOSIS — E119 Type 2 diabetes mellitus without complications: Secondary | ICD-10-CM

## 2023-07-30 MED ORDER — ACCU-CHEK SOFTCLIX LANCETS MISC
12 refills | Status: AC
Start: 2023-07-30 — End: ?

## 2023-07-30 MED ORDER — GLUCOSE BLOOD VI STRP
ORAL_STRIP | 12 refills | Status: AC
Start: 2023-07-30 — End: ?

## 2023-07-30 NOTE — Telephone Encounter (Signed)
Initiated Prior authorization ZOX:[id number] Libre 3 Sensor Via: Covermymeds Case/Key:[id number] Status: approved as of  07/30/23 Reason:Drug is covered by current benefit plan. No further PA activity needed Notified Pt via: Mychart

## 2023-08-20 ENCOUNTER — Encounter: Payer: Self-pay | Admitting: Family Medicine

## 2023-08-20 ENCOUNTER — Ambulatory Visit (INDEPENDENT_AMBULATORY_CARE_PROVIDER_SITE_OTHER): Payer: BLUE CROSS/BLUE SHIELD | Admitting: Family Medicine

## 2023-08-20 VITALS — BP 115/75 | HR 63 | Temp 98.1°F | Resp 18 | Ht 74.0 in | Wt 255.1 lb

## 2023-08-20 DIAGNOSIS — Z794 Long term (current) use of insulin: Secondary | ICD-10-CM | POA: Diagnosis not present

## 2023-08-20 DIAGNOSIS — E119 Type 2 diabetes mellitus without complications: Secondary | ICD-10-CM

## 2023-08-20 MED ORDER — DEXCOM G6 RECEIVER DEVI
1.0000 | Freq: Two times a day (BID) | 0 refills | Status: AC
Start: 2023-08-20 — End: ?

## 2023-08-20 MED ORDER — LANTUS SOLOSTAR 100 UNIT/ML ~~LOC~~ SOPN
20.0000 [IU] | PEN_INJECTOR | Freq: Every day | SUBCUTANEOUS | 1 refills | Status: DC
Start: 2023-08-20 — End: 2024-01-25

## 2023-08-20 MED ORDER — DEXCOM G6 SENSOR MISC
1.0000 | Freq: Two times a day (BID) | 1 refills | Status: AC
Start: 2023-08-20 — End: ?

## 2023-08-20 MED ORDER — DEXCOM G6 TRANSMITTER MISC
1.0000 | Freq: Two times a day (BID) | 3 refills | Status: AC
Start: 2023-08-20 — End: ?

## 2023-08-20 NOTE — Progress Notes (Signed)
Established Patient Office Visit  Subjective   Patient ID: Kyle Schmidt, male    DOB: 06-27-1986  Age: 37 y.o. MRN: [id number]  Chief Complaint  Patient presents with   Medical Management of Chronic Issues    Patient is here for a 4 week follow up for Diabetes    HPI  Diabetes: Medication compliance: taking Lantus 10 units daily and glipizide 5 mg daily. There has been days that he took up to 25 units when fasting glucose in 300's without significant changes in blood sugar.  Denies chest pain, shortness of breath, vision changes, polydipsia, polyphagia, polyuria. Denies hypoglycemia.  Pertinent lab work: A1C: 07/22/23 10.6 Monitoring: blood sugar readings at home: blood sugars ranging 200-300's throughout the day.     Continue current medication regimen: increasing the lantus to 20 units.  Well controlled: not well controlled, will increase Lantus and make referral to endocrine for further evaluation.   Follow-up: TBD after seeing endocrine.  Has made diet changes, has gained 13 pounds.    Review of Systems  Eyes:  Negative for blurred vision and double vision.  Respiratory:  Negative for shortness of breath.   Cardiovascular:  Negative for chest pain.  Neurological:  Negative for dizziness and headaches.  Endo/Heme/Allergies:  Negative for polydipsia.      Objective:     BP 115/75   Pulse 63   Temp 98.1 F (36.7 C) (Oral)   Resp 18   Ht 6\' 2"  (1.88 m)   Wt 255 lb 1.6 oz (115.7 kg)   SpO2 100%   BMI 32.75 kg/m  BP Readings from Last 3 Encounters:  08/20/23 115/75  07/23/23 127/87  07/22/23 120/78      Physical Exam Vitals and nursing note reviewed.  Constitutional:      General: He is not in acute distress.    Appearance: Normal appearance.  Pulmonary:     Effort: Pulmonary effort is normal.  Skin:    General: Skin is warm and dry.  Neurological:     General: No focal deficit present.     Mental Status: He is alert. Mental status is at baseline.   Psychiatric:        Mood and Affect: Mood normal.        Behavior: Behavior normal.        Thought Content: Thought content normal.        Judgment: Judgment normal.     No results found for any visits on 08/20/23.    The ASCVD Risk score (Arnett DK, et al., 2019) failed to calculate for the following reasons:   The 2019 ASCVD risk score is only valid for ages 59 to 71    Assessment & Plan:   Type 2 diabetes mellitus without complication, with long-term current use of insulin (HCC) Assessment & Plan: Blood sugar at home continues to be 200-300's after starting Lantus. Taking Lantus and glipizide 5mg  as prescribed. Urgent endocrine referral placed today for further evaluation. Has changed diet, avoiding sweetened beverages, not drinking alcohol, and has gained weight. Lantus increased today to 20 units. Dexcom ordered with hopes that insurance will cover it. Await endocrine input.   Orders: -     Dexcom G6 Receiver; 1 each by Does not apply route 2 (two) times daily.  Dispense: 1 each; Refill: 0 -     Dexcom G6 Sensor; 1 each by Does not apply route 2 (two) times daily.  Dispense: 3 each; Refill: 1 -  Dexcom G6 Transmitter; 1 each by Does not apply route 2 (two) times daily.  Dispense: 1 each; Refill: 3 -     Lantus SoloStar; Inject 20 Units into the skin daily.  Dispense: 15 mL; Refill: 1 -     Ambulatory referral to Endocrinology   Agrees with plan of care discussed.  Questions answered.    Return for TBD based on endocrine recommendation .    Novella Olive, FNP

## 2023-08-20 NOTE — Assessment & Plan Note (Signed)
Blood sugar at home continues to be 200-300's after starting Lantus. Taking Lantus and glipizide 5mg  as prescribed. Urgent endocrine referral placed today for further evaluation. Has changed diet, avoiding sweetened beverages, not drinking alcohol, and has gained weight. Lantus increased today to 20 units. Dexcom ordered with hopes that insurance will cover it. Await endocrine input.

## 2024-01-25 ENCOUNTER — Other Ambulatory Visit: Payer: Self-pay | Admitting: Family Medicine

## 2024-01-25 DIAGNOSIS — E119 Type 2 diabetes mellitus without complications: Secondary | ICD-10-CM

## 2024-01-31 ENCOUNTER — Other Ambulatory Visit: Payer: Self-pay | Admitting: Family Medicine

## 2024-01-31 DIAGNOSIS — E119 Type 2 diabetes mellitus without complications: Secondary | ICD-10-CM

## 2024-02-15 ENCOUNTER — Other Ambulatory Visit: Payer: Self-pay | Admitting: Family Medicine

## 2024-02-15 DIAGNOSIS — Z794 Long term (current) use of insulin: Secondary | ICD-10-CM

## 2024-02-15 MED ORDER — LANTUS SOLOSTAR 100 UNIT/ML ~~LOC~~ SOPN
20.0000 [IU] | PEN_INJECTOR | Freq: Every day | SUBCUTANEOUS | 1 refills | Status: AC
Start: 2024-02-15 — End: ?

## 2024-02-15 NOTE — Telephone Encounter (Signed)
Lantus Solostar sent to pharmacy

## 2024-02-15 NOTE — Telephone Encounter (Signed)
Copied from CRM [id number]. Topic: Clinical - Prescription Issue >> Feb 11, 2024 11:52 AM Fuller Mandril wrote: Reason for CRM: Pharmacy called states patient insurance will not pay for SEMGLEE, YFGN, 100 UNIT/ML Pen. States may require prior authorization. States patient was previously taking insulin glargine (LANTUS SOLOSTAR) 100 UNIT/ML Solostar Pen and would like to know if it can be changed back to that. Thank You

## 2024-11-13 DIAGNOSIS — B9689 Other specified bacterial agents as the cause of diseases classified elsewhere: Secondary | ICD-10-CM | POA: Diagnosis not present

## 2024-11-13 DIAGNOSIS — J208 Acute bronchitis due to other specified organisms: Secondary | ICD-10-CM | POA: Diagnosis not present

## 2024-11-15 DIAGNOSIS — L03115 Cellulitis of right lower limb: Secondary | ICD-10-CM | POA: Diagnosis not present

## 2024-11-15 DIAGNOSIS — L97528 Non-pressure chronic ulcer of other part of left foot with other specified severity: Secondary | ICD-10-CM | POA: Diagnosis not present

## 2024-11-15 DIAGNOSIS — E11621 Type 2 diabetes mellitus with foot ulcer: Secondary | ICD-10-CM | POA: Diagnosis not present

## 2024-11-16 DIAGNOSIS — Z794 Long term (current) use of insulin: Secondary | ICD-10-CM | POA: Diagnosis not present

## 2024-11-16 DIAGNOSIS — M7989 Other specified soft tissue disorders: Secondary | ICD-10-CM | POA: Diagnosis not present

## 2024-11-16 DIAGNOSIS — L089 Local infection of the skin and subcutaneous tissue, unspecified: Secondary | ICD-10-CM | POA: Diagnosis not present

## 2024-11-16 DIAGNOSIS — E871 Hypo-osmolality and hyponatremia: Secondary | ICD-10-CM | POA: Diagnosis not present

## 2024-11-16 DIAGNOSIS — M726 Necrotizing fasciitis: Secondary | ICD-10-CM | POA: Diagnosis not present

## 2024-11-16 DIAGNOSIS — L03116 Cellulitis of left lower limb: Secondary | ICD-10-CM | POA: Diagnosis not present

## 2024-11-16 DIAGNOSIS — R600 Localized edema: Secondary | ICD-10-CM | POA: Diagnosis not present

## 2024-11-16 DIAGNOSIS — E1165 Type 2 diabetes mellitus with hyperglycemia: Secondary | ICD-10-CM | POA: Diagnosis not present

## 2024-11-16 DIAGNOSIS — E11628 Type 2 diabetes mellitus with other skin complications: Secondary | ICD-10-CM | POA: Diagnosis not present

## 2024-11-17 DIAGNOSIS — E11628 Type 2 diabetes mellitus with other skin complications: Secondary | ICD-10-CM | POA: Diagnosis not present

## 2024-11-17 DIAGNOSIS — L089 Local infection of the skin and subcutaneous tissue, unspecified: Secondary | ICD-10-CM | POA: Diagnosis not present

## 2024-11-17 DIAGNOSIS — E1165 Type 2 diabetes mellitus with hyperglycemia: Secondary | ICD-10-CM | POA: Diagnosis not present

## 2024-11-18 DIAGNOSIS — L089 Local infection of the skin and subcutaneous tissue, unspecified: Secondary | ICD-10-CM | POA: Diagnosis not present

## 2024-11-18 DIAGNOSIS — E11628 Type 2 diabetes mellitus with other skin complications: Secondary | ICD-10-CM | POA: Diagnosis not present

## 2024-11-18 DIAGNOSIS — Z794 Long term (current) use of insulin: Secondary | ICD-10-CM | POA: Diagnosis not present

## 2024-11-19 DIAGNOSIS — E1165 Type 2 diabetes mellitus with hyperglycemia: Secondary | ICD-10-CM | POA: Diagnosis not present

## 2024-11-19 DIAGNOSIS — L089 Local infection of the skin and subcutaneous tissue, unspecified: Secondary | ICD-10-CM | POA: Diagnosis not present

## 2024-11-19 DIAGNOSIS — Z794 Long term (current) use of insulin: Secondary | ICD-10-CM | POA: Diagnosis not present

## 2024-11-19 DIAGNOSIS — E11628 Type 2 diabetes mellitus with other skin complications: Secondary | ICD-10-CM | POA: Diagnosis not present

## 2024-11-20 DIAGNOSIS — Z794 Long term (current) use of insulin: Secondary | ICD-10-CM | POA: Diagnosis not present

## 2024-11-20 DIAGNOSIS — E11628 Type 2 diabetes mellitus with other skin complications: Secondary | ICD-10-CM | POA: Diagnosis not present

## 2024-11-20 DIAGNOSIS — L089 Local infection of the skin and subcutaneous tissue, unspecified: Secondary | ICD-10-CM | POA: Diagnosis not present

## 2024-11-21 DIAGNOSIS — E11628 Type 2 diabetes mellitus with other skin complications: Secondary | ICD-10-CM | POA: Diagnosis not present

## 2024-11-21 DIAGNOSIS — E11621 Type 2 diabetes mellitus with foot ulcer: Secondary | ICD-10-CM | POA: Diagnosis not present

## 2024-11-21 DIAGNOSIS — L97523 Non-pressure chronic ulcer of other part of left foot with necrosis of muscle: Secondary | ICD-10-CM | POA: Diagnosis not present

## 2024-11-21 DIAGNOSIS — L03116 Cellulitis of left lower limb: Secondary | ICD-10-CM | POA: Diagnosis not present

## 2024-11-21 DIAGNOSIS — L089 Local infection of the skin and subcutaneous tissue, unspecified: Secondary | ICD-10-CM | POA: Diagnosis not present

## 2024-11-21 DIAGNOSIS — M7989 Other specified soft tissue disorders: Secondary | ICD-10-CM | POA: Diagnosis not present

## 2024-11-22 DIAGNOSIS — L03116 Cellulitis of left lower limb: Secondary | ICD-10-CM | POA: Diagnosis not present

## 2024-11-22 DIAGNOSIS — E11628 Type 2 diabetes mellitus with other skin complications: Secondary | ICD-10-CM | POA: Diagnosis not present

## 2024-11-22 DIAGNOSIS — L089 Local infection of the skin and subcutaneous tissue, unspecified: Secondary | ICD-10-CM | POA: Diagnosis not present

## 2024-11-24 DIAGNOSIS — L03116 Cellulitis of left lower limb: Secondary | ICD-10-CM | POA: Diagnosis not present

## 2024-11-25 DIAGNOSIS — E1165 Type 2 diabetes mellitus with hyperglycemia: Secondary | ICD-10-CM | POA: Diagnosis not present

## 2024-11-25 DIAGNOSIS — L089 Local infection of the skin and subcutaneous tissue, unspecified: Secondary | ICD-10-CM | POA: Diagnosis not present

## 2024-11-25 DIAGNOSIS — Z794 Long term (current) use of insulin: Secondary | ICD-10-CM | POA: Diagnosis not present

## 2024-11-26 DIAGNOSIS — Z794 Long term (current) use of insulin: Secondary | ICD-10-CM | POA: Diagnosis not present

## 2024-11-26 DIAGNOSIS — L089 Local infection of the skin and subcutaneous tissue, unspecified: Secondary | ICD-10-CM | POA: Diagnosis not present

## 2024-11-26 DIAGNOSIS — E11628 Type 2 diabetes mellitus with other skin complications: Secondary | ICD-10-CM | POA: Diagnosis not present

## 2024-11-27 DIAGNOSIS — L089 Local infection of the skin and subcutaneous tissue, unspecified: Secondary | ICD-10-CM | POA: Diagnosis not present

## 2024-11-27 DIAGNOSIS — Z794 Long term (current) use of insulin: Secondary | ICD-10-CM | POA: Diagnosis not present

## 2024-11-27 DIAGNOSIS — E11628 Type 2 diabetes mellitus with other skin complications: Secondary | ICD-10-CM | POA: Diagnosis not present

## 2024-11-28 DIAGNOSIS — E11628 Type 2 diabetes mellitus with other skin complications: Secondary | ICD-10-CM | POA: Diagnosis not present

## 2024-11-28 DIAGNOSIS — L089 Local infection of the skin and subcutaneous tissue, unspecified: Secondary | ICD-10-CM | POA: Diagnosis not present

## 2024-11-28 DIAGNOSIS — Z794 Long term (current) use of insulin: Secondary | ICD-10-CM | POA: Diagnosis not present

## 2024-11-28 DIAGNOSIS — E1165 Type 2 diabetes mellitus with hyperglycemia: Secondary | ICD-10-CM | POA: Diagnosis not present

## 2024-11-28 DIAGNOSIS — L03116 Cellulitis of left lower limb: Secondary | ICD-10-CM | POA: Diagnosis not present

## 2024-11-29 ENCOUNTER — Telehealth: Payer: Self-pay

## 2024-11-29 DIAGNOSIS — R29898 Other symptoms and signs involving the musculoskeletal system: Secondary | ICD-10-CM

## 2024-11-29 DIAGNOSIS — E1165 Type 2 diabetes mellitus with hyperglycemia: Secondary | ICD-10-CM | POA: Diagnosis not present

## 2024-11-29 DIAGNOSIS — E1169 Type 2 diabetes mellitus with other specified complication: Secondary | ICD-10-CM | POA: Diagnosis not present

## 2024-11-29 DIAGNOSIS — L089 Local infection of the skin and subcutaneous tissue, unspecified: Secondary | ICD-10-CM | POA: Diagnosis not present

## 2024-11-29 DIAGNOSIS — T8189XA Other complications of procedures, not elsewhere classified, initial encounter: Secondary | ICD-10-CM | POA: Diagnosis not present

## 2024-11-29 DIAGNOSIS — L03116 Cellulitis of left lower limb: Secondary | ICD-10-CM | POA: Diagnosis not present

## 2024-11-29 DIAGNOSIS — Z794 Long term (current) use of insulin: Secondary | ICD-10-CM | POA: Diagnosis not present

## 2024-11-29 DIAGNOSIS — E119 Type 2 diabetes mellitus without complications: Secondary | ICD-10-CM

## 2024-11-30 ENCOUNTER — Telehealth: Payer: Self-pay | Admitting: *Deleted

## 2024-11-30 NOTE — Transitions of Care (Post Inpatient/ED Visit) (Signed)
   11/30/2024  Name: Kyle Schmidt MRN: [id number] DOB: 16-Jun-1986  Today's TOC FU Call Status: Today's TOC FU Call Status:: Unsuccessful Call (1st Attempt) Unsuccessful Call (1st Attempt) Date: 11/30/24  Attempted to reach the patient regarding the most recent Inpatient/ED visit.  Follow Up Plan: Additional outreach attempts will be made to reach the patient to complete the Transitions of Care (Post Inpatient/ED visit) call.   Andrea Dimes RN, BSN [place]  [facility] RN Care Manager [phone]

## 2024-12-01 ENCOUNTER — Telehealth: Payer: Self-pay

## 2024-12-01 NOTE — Transitions of Care (Post Inpatient/ED Visit) (Signed)
   12/01/2024  Name: Kyle Schmidt MRN: [id number] DOB: [birth year]  Today's TOC FU Call Status: Today's TOC FU Call Status:: Unsuccessful Call (2nd Attempt) Unsuccessful Call (1st Attempt) Date: 11/30/24 Unsuccessful Call (2nd Attempt) Date: 12/01/24  Attempted to reach the patient regarding the most recent Inpatient/ED visit.  Follow Up Plan: Additional outreach attempts will be made to reach the patient to complete the Transitions of Care (Post Inpatient/ED visit) call.   Richerd Fish, RN, BSN, CCM [facility] Management Coordinator Direct Dial: [phone]

## 2024-12-02 ENCOUNTER — Telehealth: Payer: Self-pay | Admitting: *Deleted

## 2024-12-02 NOTE — Transitions of Care (Post Inpatient/ED Visit) (Signed)
   12/02/2024  Name: Kyle Schmidt MRN: [id number] DOB: [birth year]  Today's TOC FU Call Status: Today's TOC FU Call Status:: Successful TOC FU Call Completed TOC FU Call Complete Date: 12/02/24  Patient's Name and Date of Birth confirmed. Name, DOB  Transition Care Management Follow-up Telephone Call Date of Discharge: 11/29/24 Discharge Facility: Other ([facility]) Name of Other ([facility]) Discharge Facility: [facility] Type of Discharge: Inpatient Admission Primary Inpatient Discharge Diagnosis:: cellulitis of left lower extremity  Patient has a new PCP with [facility]. RNCM advised patient to contact his insurance plan to provide updated information(PCP and address). Patient voiced understanding.    Andrea Dimes RN, BSN [place]  [facility] RN Care Manager [phone]

## 2024-12-05 DIAGNOSIS — Z794 Long term (current) use of insulin: Secondary | ICD-10-CM | POA: Diagnosis not present

## 2024-12-05 DIAGNOSIS — G4733 Obstructive sleep apnea (adult) (pediatric): Secondary | ICD-10-CM | POA: Diagnosis not present

## 2024-12-05 DIAGNOSIS — L089 Local infection of the skin and subcutaneous tissue, unspecified: Secondary | ICD-10-CM | POA: Diagnosis not present

## 2024-12-05 DIAGNOSIS — L03116 Cellulitis of left lower limb: Secondary | ICD-10-CM | POA: Diagnosis not present

## 2024-12-05 DIAGNOSIS — Z6832 Body mass index (BMI) 32.0-32.9, adult: Secondary | ICD-10-CM | POA: Diagnosis not present

## 2024-12-05 DIAGNOSIS — E6609 Other obesity due to excess calories: Secondary | ICD-10-CM | POA: Diagnosis not present

## 2024-12-05 DIAGNOSIS — E1165 Type 2 diabetes mellitus with hyperglycemia: Secondary | ICD-10-CM | POA: Diagnosis not present

## 2024-12-05 DIAGNOSIS — E66811 Obesity, class 1: Secondary | ICD-10-CM | POA: Diagnosis not present

## 2024-12-05 DIAGNOSIS — E11628 Type 2 diabetes mellitus with other skin complications: Secondary | ICD-10-CM | POA: Diagnosis not present

## 2024-12-06 DIAGNOSIS — L089 Local infection of the skin and subcutaneous tissue, unspecified: Secondary | ICD-10-CM | POA: Diagnosis not present

## 2024-12-06 DIAGNOSIS — E11628 Type 2 diabetes mellitus with other skin complications: Secondary | ICD-10-CM | POA: Diagnosis not present

## 2024-12-09 DIAGNOSIS — E1169 Type 2 diabetes mellitus with other specified complication: Secondary | ICD-10-CM | POA: Diagnosis not present

## 2024-12-09 DIAGNOSIS — T8189XA Other complications of procedures, not elsewhere classified, initial encounter: Secondary | ICD-10-CM | POA: Diagnosis not present

## 2024-12-16 DIAGNOSIS — E11628 Type 2 diabetes mellitus with other skin complications: Secondary | ICD-10-CM | POA: Diagnosis not present

## 2024-12-16 DIAGNOSIS — L089 Local infection of the skin and subcutaneous tissue, unspecified: Secondary | ICD-10-CM | POA: Diagnosis not present
